# Patient Record
Sex: Female | Born: 1970 | Race: Black or African American | Hispanic: No
Health system: Southern US, Community
[De-identification: ages and names within clinical notes are randomized; demographics above are authoritative.]

## PROBLEM LIST (undated history)

## (undated) DIAGNOSIS — N390 Urinary tract infection, site not specified: Secondary | ICD-10-CM

## (undated) DIAGNOSIS — N760 Acute vaginitis: Secondary | ICD-10-CM

## (undated) DIAGNOSIS — B9689 Other specified bacterial agents as the cause of diseases classified elsewhere: Secondary | ICD-10-CM

## (undated) HISTORY — PX: TUBAL LIGATION: SHX77

## (undated) HISTORY — DX: Urinary tract infection, site not specified: N39.0

## (undated) HISTORY — DX: Acute vaginitis: N76.0

## (undated) HISTORY — DX: Other specified bacterial agents as the cause of diseases classified elsewhere: B96.89

---

## 1997-07-25 ENCOUNTER — Inpatient Hospital Stay (HOSPITAL_COMMUNITY): Admission: AD | Admit: 1997-07-25 | Discharge: 1997-07-25 | Payer: Self-pay | Admitting: *Deleted

## 1997-09-08 ENCOUNTER — Ambulatory Visit (HOSPITAL_COMMUNITY): Admission: RE | Admit: 1997-09-08 | Discharge: 1997-09-08 | Payer: Self-pay | Admitting: Obstetrics & Gynecology

## 1997-10-02 ENCOUNTER — Other Ambulatory Visit: Admission: RE | Admit: 1997-10-02 | Discharge: 1997-10-02 | Payer: Self-pay | Admitting: Obstetrics

## 1997-11-30 ENCOUNTER — Inpatient Hospital Stay (HOSPITAL_COMMUNITY): Admission: AD | Admit: 1997-11-30 | Discharge: 1997-11-30 | Payer: Self-pay | Admitting: Obstetrics & Gynecology

## 1997-12-04 ENCOUNTER — Inpatient Hospital Stay (HOSPITAL_COMMUNITY): Admission: AD | Admit: 1997-12-04 | Discharge: 1997-12-07 | Payer: Self-pay | Admitting: Obstetrics & Gynecology

## 1997-12-11 ENCOUNTER — Inpatient Hospital Stay (HOSPITAL_COMMUNITY): Admission: AD | Admit: 1997-12-11 | Discharge: 1997-12-11 | Payer: Self-pay | Admitting: Obstetrics

## 1997-12-19 ENCOUNTER — Inpatient Hospital Stay (HOSPITAL_COMMUNITY): Admission: AD | Admit: 1997-12-19 | Discharge: 1997-12-19 | Payer: Self-pay | Admitting: *Deleted

## 1997-12-29 ENCOUNTER — Inpatient Hospital Stay (HOSPITAL_COMMUNITY): Admission: AD | Admit: 1997-12-29 | Discharge: 1997-12-29 | Payer: Self-pay | Admitting: *Deleted

## 1998-01-04 ENCOUNTER — Inpatient Hospital Stay (HOSPITAL_COMMUNITY): Admission: AD | Admit: 1998-01-04 | Discharge: 1998-01-04 | Payer: Self-pay | Admitting: Obstetrics

## 1998-01-08 ENCOUNTER — Inpatient Hospital Stay (HOSPITAL_COMMUNITY): Admission: AD | Admit: 1998-01-08 | Discharge: 1998-01-08 | Payer: Self-pay | Admitting: Obstetrics

## 1998-01-12 ENCOUNTER — Inpatient Hospital Stay (HOSPITAL_COMMUNITY): Admission: AD | Admit: 1998-01-12 | Discharge: 1998-01-12 | Payer: Self-pay | Admitting: *Deleted

## 1998-01-16 ENCOUNTER — Inpatient Hospital Stay (HOSPITAL_COMMUNITY): Admission: AD | Admit: 1998-01-16 | Discharge: 1998-01-16 | Payer: Self-pay | Admitting: *Deleted

## 1998-01-24 ENCOUNTER — Inpatient Hospital Stay (HOSPITAL_COMMUNITY): Admission: AD | Admit: 1998-01-24 | Discharge: 1998-01-25 | Payer: Self-pay | Admitting: Obstetrics

## 1998-12-09 ENCOUNTER — Emergency Department (HOSPITAL_COMMUNITY): Admission: EM | Admit: 1998-12-09 | Discharge: 1998-12-09 | Payer: Self-pay | Admitting: Emergency Medicine

## 1999-02-02 ENCOUNTER — Emergency Department (HOSPITAL_COMMUNITY): Admission: EM | Admit: 1999-02-02 | Discharge: 1999-02-02 | Payer: Self-pay | Admitting: Emergency Medicine

## 2000-01-19 ENCOUNTER — Emergency Department (HOSPITAL_COMMUNITY): Admission: EM | Admit: 2000-01-19 | Discharge: 2000-01-19 | Payer: Self-pay | Admitting: Emergency Medicine

## 2001-01-20 ENCOUNTER — Emergency Department (HOSPITAL_COMMUNITY): Admission: EM | Admit: 2001-01-20 | Discharge: 2001-01-21 | Payer: Self-pay | Admitting: Emergency Medicine

## 2001-01-21 ENCOUNTER — Emergency Department (HOSPITAL_COMMUNITY): Admission: EM | Admit: 2001-01-21 | Discharge: 2001-01-22 | Payer: Self-pay | Admitting: Emergency Medicine

## 2002-05-27 ENCOUNTER — Encounter: Admission: RE | Admit: 2002-05-27 | Discharge: 2002-05-27 | Payer: Self-pay | Admitting: Family Medicine

## 2002-12-04 ENCOUNTER — Encounter: Admission: RE | Admit: 2002-12-04 | Discharge: 2002-12-04 | Payer: Self-pay | Admitting: Family Medicine

## 2003-06-16 ENCOUNTER — Encounter: Admission: RE | Admit: 2003-06-16 | Discharge: 2003-06-16 | Payer: Self-pay | Admitting: Family Medicine

## 2003-07-27 ENCOUNTER — Encounter: Admission: RE | Admit: 2003-07-27 | Discharge: 2003-07-27 | Payer: Self-pay | Admitting: Family Medicine

## 2003-07-28 ENCOUNTER — Encounter: Admission: RE | Admit: 2003-07-28 | Discharge: 2003-07-28 | Payer: Self-pay | Admitting: Sports Medicine

## 2003-07-29 ENCOUNTER — Encounter: Admission: RE | Admit: 2003-07-29 | Discharge: 2003-07-29 | Payer: Self-pay | Admitting: Sports Medicine

## 2003-08-31 ENCOUNTER — Encounter: Admission: RE | Admit: 2003-08-31 | Discharge: 2003-08-31 | Payer: Self-pay | Admitting: Family Medicine

## 2004-06-17 ENCOUNTER — Ambulatory Visit: Payer: Self-pay | Admitting: Family Medicine

## 2005-01-24 ENCOUNTER — Ambulatory Visit: Payer: Self-pay | Admitting: Sports Medicine

## 2005-01-27 ENCOUNTER — Ambulatory Visit: Payer: Self-pay | Admitting: Family Medicine

## 2005-03-01 ENCOUNTER — Ambulatory Visit: Payer: Self-pay | Admitting: Sports Medicine

## 2005-03-08 ENCOUNTER — Ambulatory Visit: Payer: Self-pay | Admitting: Family Medicine

## 2005-07-30 ENCOUNTER — Encounter (INDEPENDENT_AMBULATORY_CARE_PROVIDER_SITE_OTHER): Payer: Self-pay | Admitting: *Deleted

## 2005-08-09 ENCOUNTER — Encounter (INDEPENDENT_AMBULATORY_CARE_PROVIDER_SITE_OTHER): Payer: Self-pay | Admitting: *Deleted

## 2005-08-09 ENCOUNTER — Other Ambulatory Visit: Admission: RE | Admit: 2005-08-09 | Discharge: 2005-08-09 | Payer: Self-pay | Admitting: Family Medicine

## 2005-08-09 ENCOUNTER — Ambulatory Visit: Payer: Self-pay | Admitting: Family Medicine

## 2006-06-28 DIAGNOSIS — N83209 Unspecified ovarian cyst, unspecified side: Secondary | ICD-10-CM

## 2006-06-28 DIAGNOSIS — D509 Iron deficiency anemia, unspecified: Secondary | ICD-10-CM | POA: Insufficient documentation

## 2006-06-29 ENCOUNTER — Encounter (INDEPENDENT_AMBULATORY_CARE_PROVIDER_SITE_OTHER): Payer: Self-pay | Admitting: *Deleted

## 2007-01-15 ENCOUNTER — Emergency Department (HOSPITAL_COMMUNITY): Admission: EM | Admit: 2007-01-15 | Discharge: 2007-01-15 | Payer: Self-pay | Admitting: Emergency Medicine

## 2007-01-31 ENCOUNTER — Encounter (INDEPENDENT_AMBULATORY_CARE_PROVIDER_SITE_OTHER): Payer: Self-pay | Admitting: Family Medicine

## 2007-01-31 ENCOUNTER — Other Ambulatory Visit: Admission: RE | Admit: 2007-01-31 | Discharge: 2007-01-31 | Payer: Self-pay | Admitting: Family Medicine

## 2007-01-31 ENCOUNTER — Ambulatory Visit: Payer: Self-pay | Admitting: Family Medicine

## 2007-01-31 DIAGNOSIS — R300 Dysuria: Secondary | ICD-10-CM | POA: Insufficient documentation

## 2007-01-31 DIAGNOSIS — B9689 Other specified bacterial agents as the cause of diseases classified elsewhere: Secondary | ICD-10-CM | POA: Insufficient documentation

## 2007-01-31 DIAGNOSIS — N760 Acute vaginitis: Secondary | ICD-10-CM

## 2007-01-31 LAB — CONVERTED CEMR LAB
Bilirubin Urine: NEGATIVE
Chlamydia, DNA Probe: NEGATIVE
GC Probe Amp, Genital: NEGATIVE
Nitrite: NEGATIVE
Protein, U semiquant: NEGATIVE
Urobilinogen, UA: 0.2

## 2007-02-04 ENCOUNTER — Encounter (INDEPENDENT_AMBULATORY_CARE_PROVIDER_SITE_OTHER): Payer: Self-pay | Admitting: Family Medicine

## 2007-06-24 ENCOUNTER — Encounter (INDEPENDENT_AMBULATORY_CARE_PROVIDER_SITE_OTHER): Payer: Self-pay | Admitting: Family Medicine

## 2007-06-24 ENCOUNTER — Ambulatory Visit: Payer: Self-pay | Admitting: Sports Medicine

## 2007-06-24 DIAGNOSIS — N920 Excessive and frequent menstruation with regular cycle: Secondary | ICD-10-CM

## 2007-06-24 DIAGNOSIS — R079 Chest pain, unspecified: Secondary | ICD-10-CM

## 2007-06-24 DIAGNOSIS — R011 Cardiac murmur, unspecified: Secondary | ICD-10-CM

## 2007-06-24 LAB — CONVERTED CEMR LAB
Bilirubin Urine: NEGATIVE
Glucose, Urine, Semiquant: NEGATIVE
Ketones, urine, test strip: NEGATIVE
RBC / HPF: 0
Urobilinogen, UA: 0.2
pH: 7

## 2007-06-25 LAB — CONVERTED CEMR LAB
Hemoglobin: 7.3 g/dL — CL (ref 12.0–15.0)
MCHC: 26.1 g/dL — ABNORMAL LOW (ref 30.0–36.0)
MCV: 63.9 fL — ABNORMAL LOW (ref 78.0–100.0)
RBC: 4.38 M/uL (ref 3.87–5.11)
TSH: 0.878 microintl units/mL (ref 0.350–5.50)
WBC: 4.7 10*3/uL (ref 4.0–10.5)

## 2007-06-27 ENCOUNTER — Encounter (INDEPENDENT_AMBULATORY_CARE_PROVIDER_SITE_OTHER): Payer: Self-pay | Admitting: *Deleted

## 2007-06-28 ENCOUNTER — Telehealth (INDEPENDENT_AMBULATORY_CARE_PROVIDER_SITE_OTHER): Payer: Self-pay | Admitting: Family Medicine

## 2007-06-28 ENCOUNTER — Encounter: Admission: RE | Admit: 2007-06-28 | Discharge: 2007-06-28 | Payer: Self-pay | Admitting: Family Medicine

## 2007-08-15 ENCOUNTER — Other Ambulatory Visit: Admission: RE | Admit: 2007-08-15 | Discharge: 2007-08-15 | Payer: Self-pay | Admitting: Family Medicine

## 2007-08-15 ENCOUNTER — Ambulatory Visit: Payer: Self-pay | Admitting: Obstetrics & Gynecology

## 2007-08-29 ENCOUNTER — Ambulatory Visit: Payer: Self-pay | Admitting: Obstetrics and Gynecology

## 2008-08-05 ENCOUNTER — Ambulatory Visit: Payer: Self-pay | Admitting: Family Medicine

## 2008-08-05 ENCOUNTER — Encounter (INDEPENDENT_AMBULATORY_CARE_PROVIDER_SITE_OTHER): Payer: Self-pay | Admitting: Family Medicine

## 2008-08-05 ENCOUNTER — Other Ambulatory Visit: Admission: RE | Admit: 2008-08-05 | Discharge: 2008-08-05 | Payer: Self-pay | Admitting: Family Medicine

## 2008-08-05 ENCOUNTER — Encounter: Payer: Self-pay | Admitting: *Deleted

## 2008-08-05 DIAGNOSIS — N63 Unspecified lump in unspecified breast: Secondary | ICD-10-CM | POA: Insufficient documentation

## 2008-08-05 LAB — CONVERTED CEMR LAB
Glucose, Urine, Semiquant: NEGATIVE
Ketones, urine, test strip: NEGATIVE
Protein, U semiquant: 30
Specific Gravity, Urine: 1.02
pH: 7

## 2008-08-06 LAB — CONVERTED CEMR LAB
Cholesterol: 106 mg/dL (ref 0–200)
HDL: 60 mg/dL (ref >39)
MCHC: 26.1 g/dL — ABNORMAL LOW (ref 30.0–36.0)
Platelets: 223 10*3/uL (ref 150–400)
RDW: 21.8 % — ABNORMAL HIGH (ref 11.5–15.5)
Triglycerides: 56 mg/dL (ref <150)

## 2008-08-11 ENCOUNTER — Encounter (INDEPENDENT_AMBULATORY_CARE_PROVIDER_SITE_OTHER): Payer: Self-pay | Admitting: Family Medicine

## 2009-05-14 ENCOUNTER — Ambulatory Visit: Payer: Self-pay | Admitting: Family Medicine

## 2009-10-09 ENCOUNTER — Emergency Department (HOSPITAL_COMMUNITY): Admission: EM | Admit: 2009-10-09 | Discharge: 2009-10-09 | Payer: Self-pay | Admitting: Emergency Medicine

## 2009-12-20 ENCOUNTER — Emergency Department (HOSPITAL_COMMUNITY): Admission: EM | Admit: 2009-12-20 | Discharge: 2009-12-20 | Payer: Self-pay | Admitting: Emergency Medicine

## 2010-05-22 ENCOUNTER — Encounter: Payer: Self-pay | Admitting: Family Medicine

## 2010-05-31 NOTE — Assessment & Plan Note (Signed)
Summary: tb test,tcb  Nurse Visit   Allergies: 1)  Erythromycin (Erythromycin)  Immunizations Administered:  PPD Skin Test:    Vaccine Type: PPD    Site: right forearm    Mfr: Sanofi Pasteur    Dose: 0.1 ml    Route: ID    Given by: Theresia Lo RN    Exp. Date: 08/25/2011    Lot #: [id number]  Orders Added: 1)  TB Skin Test [45409] 2)  Admin 1st Vaccine [90471]   patient presents a form for her work that MD will need to fill out. last CPE was April of 2010. will place form in MD box..patient will return on 05/17/2009 to have PPD read. she understands that form may not be filled out by that time and she knows she may have to come back later to pick up form. Theresia Lo RN  May 14, 2009 1:49 PM

## 2010-06-01 ENCOUNTER — Encounter: Payer: Self-pay | Admitting: *Deleted

## 2010-07-15 LAB — URINALYSIS, ROUTINE W REFLEX MICROSCOPIC
Glucose, UA: NEGATIVE mg/dL
Protein, ur: NEGATIVE mg/dL
Specific Gravity, Urine: 1.012 (ref 1.005–1.030)
pH: 8.5 — ABNORMAL HIGH (ref 5.0–8.0)

## 2010-07-15 LAB — CBC
HCT: 25.9 % — ABNORMAL LOW (ref 36.0–46.0)
MCHC: 29.7 g/dL — ABNORMAL LOW (ref 30.0–36.0)
RDW: 20.2 % — ABNORMAL HIGH (ref 11.5–15.5)

## 2010-07-15 LAB — BASIC METABOLIC PANEL
BUN: 6 mg/dL (ref 6–23)
Creatinine, Ser: 0.82 mg/dL (ref 0.4–1.2)
GFR calc non Af Amer: >60 mL/min (ref >60)
Glucose, Bld: 91 mg/dL (ref 70–99)
Potassium: 4.3 mEq/L (ref 3.5–5.1)

## 2010-07-15 LAB — DIFFERENTIAL
Basophils Relative: 1 % (ref 0–1)
Eosinophils Absolute: 0.3 10*3/uL (ref 0.0–0.7)
Monocytes Absolute: 0.4 10*3/uL (ref 0.1–1.0)
Neutro Abs: 4.1 10*3/uL (ref 1.7–7.7)
Neutrophils Relative %: 57 % (ref 43–77)

## 2010-07-15 LAB — URINE MICROSCOPIC-ADD ON

## 2010-07-18 LAB — URINALYSIS, ROUTINE W REFLEX MICROSCOPIC
Bilirubin Urine: NEGATIVE
Glucose, UA: NEGATIVE mg/dL
Hgb urine dipstick: NEGATIVE
Ketones, ur: 40 mg/dL — AB
Nitrite: NEGATIVE
Protein, ur: NEGATIVE mg/dL
Specific Gravity, Urine: 1.025 (ref 1.005–1.030)
Urobilinogen, UA: 0.2 mg/dL (ref 0.0–1.0)
pH: 7 (ref 5.0–8.0)

## 2010-07-18 LAB — D-DIMER, QUANTITATIVE: D-Dimer, Quant: 0.34 ug/mL-FEU (ref 0.00–0.48)

## 2010-07-18 LAB — POCT I-STAT, CHEM 8
BUN: 3 mg/dL — ABNORMAL LOW (ref 6–23)
Calcium, Ion: 1.1 mmol/L — ABNORMAL LOW (ref 1.12–1.32)
Chloride: 103 mEq/L (ref 96–112)
Creatinine, Ser: 0.7 mg/dL (ref 0.4–1.2)
Glucose, Bld: 120 mg/dL — ABNORMAL HIGH (ref 70–99)
HCT: 34 % — ABNORMAL LOW (ref 36.0–46.0)
Hemoglobin: 11.6 g/dL — ABNORMAL LOW (ref 12.0–15.0)
Potassium: 4.1 mEq/L (ref 3.5–5.1)
Sodium: 135 meq/L (ref 135–145)
TCO2: 23 mmol/L (ref 0–100)

## 2010-07-18 LAB — POCT PREGNANCY, URINE: Preg Test, Ur: NEGATIVE

## 2010-09-13 NOTE — Group Therapy Note (Signed)
Laura Mckenzie, SAMMARCO NO.:  [id number]   MEDICAL RECORD NO.:  [id number]          PATIENT TYPE:  WOC   LOCATION:  [facility]                   FACILITY:  [facility]   PHYSICIAN:  Johnella Moloney, MD        DATE OF BIRTH:  [birth year]   DATE OF SERVICE:                                  CLINIC NOTE   CHIEF COMPLAINT:  Menorrhagia.   HISTORY OF PRESENT ILLNESS:  The patient is a 40 year old G48, P-0-0-4  who was referred from the [facility] for evaluation of her  menorrhagia.  The patient reports menarche at age 49.  She also reports  having regular menstrual cycles and periods that last 3-5 days with very  heavy flow and severe pain.  The patient reports that her periods  consist of having very heavy bleeding using multiple tampons and pads,  and ruining clothes.  The patient was found to be anemic with a  hemoglobin of 6.8 in spring of 2007.  She has been treated with OCPs  which the patient does not like and they have made her sick.  She has  also had a hemoglobin electrophoresis in the past and that was normal.  As part of her workup, she had workup for iron-deficiency anemia and  found to be iron deficient with a ferritin of 3. The patient was  subsequently treated with p.o. iron.  Since then, she was lost to  followup.  When she was last seen in February, she did have an  ultrasound which was remarkable for uterus measuring 9.8 cm x 5.3 cm x  5.8.  Her endometrial lining was noticeably thickened measuring 21 mm  and normal adnexa.  The patient was then referred to the GYN clinic for  an endometrial biopsy and further discussion about management of her  menorrhagia.   PAST MEDICAL HISTORY:  1. Kidney problems.  2. Heart murmur.  3. High blood pressure.   PAST SURGICAL HISTORY:  None.   PAST OB/GYN HISTORY:  Menstrual history as above.  The patient has had a  tubal ligation.  She has had 3 vaginal deliveries and 1 cesarean  section.  Her last Pap smear  was in October 2008 which was normal.  She  denies any history of abnormal Pap smears   MEDICATIONS:  Midol as needed.   ALLERGIES:  ERYTHROMYCIN.  The patient is not allergic to latex.   SOCIAL HISTORY:  The patient lives with her 4 children and 2  grandchildren.  She currently is employed.  She does not smoke.  She  drinks 2 drinks per week.  She denies any illicit drugs current or past  abuse.   REVIEW OF SYSTEMS:  The patient endorses some fatigue, frequent  headaches, nausea, vomiting, hot flashes and night sweats.  Of note, the  patient did have a TSH that was checked in family practice that was  0.878.  Her last CBC June 23, 2088 was remarkable for hemoglobin of  7.3 and a hematocrit of 28.0.   PHYSICAL EXAMINATION:  VITAL SIGNS:  Temperature 97.6, pulse 80, blood  pressure  107/79, respirations 16, weight 131.8 pounds, height 5 feet 4  inches.  GENERAL:  No apparent distress.  ABDOMEN:  Soft, nontender, nondistended.  Well-healed Pfannenstiel  incision.  PELVIC:  Normal external female genitalia.  Pink well rugated vagina.  No lesions.  Normal cervical contour, multiparous.  No uterine or  adnexal tenderness.   PROCEDURE:  Endometrial biopsy:  The patient was counseled regarding the  need for endometrial biopsy.  The risks of the biopsy were explained to  the patient and informed consent was obtained.  Her urine pregnancy was  negative.  The patient's cervix was swabbed with Betadine and anterior  lip of the cervix was grasped with a tenaculum.  The sterile __________  Pipelle was introduced into the uterine cavity and moderate amount of  tissue was obtained.  Of note, the uterus was found to a length of 7 cm.  Two passes were made and a moderate amount of tissue was obtained and  sent to pathology.  The patient was given the discharge instruction  sheet for endometrial biopsy with any signs or symptoms to watch out for  and also for her to schedule an appointment in  2 weeks for discussion of  results.   ASSESSMENT/PLAN:  The patient is a 40 year old G4, P4 who was referred  for menorrhagia.  The patient is status post an endometrial biopsy at  this point.  We will follow up the results.  Discussed options of  managing menorrhagia with the patient including hormonal methods,  including oral contraceptive pills, Depo-Provera, Mirena IUD, surgical  actions involved in a D&C, endometrial ablation or a hysterectomy.  The  patient was given written information about all these modalities to  review at home.  She will come back in 2 weeks with a decision of what  she wants to do and also to follow up her endometrial biopsy results.  In the meantime, the patient was given a prescription of ibuprofen 800  mg p.o. t.i.d. p.r.n. pain to help with her cramping after the procedure  and bleeding precautions were reviewed.           ______________________________  Johnella Moloney, MD     UD/MEDQ  D:  08/15/2007  T:  08/15/2007  Job:  [id number]

## 2010-09-23 ENCOUNTER — Other Ambulatory Visit: Payer: Self-pay | Admitting: Family Medicine

## 2010-09-23 DIAGNOSIS — Z1231 Encounter for screening mammogram for malignant neoplasm of breast: Secondary | ICD-10-CM

## 2010-10-06 ENCOUNTER — Ambulatory Visit
Admission: RE | Admit: 2010-10-06 | Discharge: 2010-10-06 | Disposition: A | Discharge status: Home / Self Care | Payer: Medicaid Other | Source: Ambulatory Visit | Attending: Family Medicine | Admitting: Family Medicine

## 2010-10-06 DIAGNOSIS — Z1231 Encounter for screening mammogram for malignant neoplasm of breast: Secondary | ICD-10-CM

## 2010-10-11 ENCOUNTER — Other Ambulatory Visit: Payer: Self-pay | Admitting: Family Medicine

## 2010-10-11 DIAGNOSIS — R928 Other abnormal and inconclusive findings on diagnostic imaging of breast: Secondary | ICD-10-CM

## 2010-10-20 ENCOUNTER — Encounter: Payer: Self-pay | Admitting: Family Medicine

## 2010-11-17 ENCOUNTER — Other Ambulatory Visit (HOSPITAL_COMMUNITY)
Admission: RE | Admit: 2010-11-17 | Discharge: 2010-11-17 | Disposition: A | Discharge status: Home / Self Care | Payer: Medicaid Other | Source: Ambulatory Visit | Attending: Family Medicine | Admitting: Family Medicine

## 2010-11-17 ENCOUNTER — Ambulatory Visit (INDEPENDENT_AMBULATORY_CARE_PROVIDER_SITE_OTHER): Payer: Medicaid Other | Admitting: Family Medicine

## 2010-11-17 DIAGNOSIS — N898 Other specified noninflammatory disorders of vagina: Secondary | ICD-10-CM

## 2010-11-17 DIAGNOSIS — F39 Unspecified mood [affective] disorder: Secondary | ICD-10-CM

## 2010-11-17 DIAGNOSIS — Z01419 Encounter for gynecological examination (general) (routine) without abnormal findings: Secondary | ICD-10-CM | POA: Insufficient documentation

## 2010-11-17 DIAGNOSIS — R300 Dysuria: Secondary | ICD-10-CM

## 2010-11-17 DIAGNOSIS — Z124 Encounter for screening for malignant neoplasm of cervix: Secondary | ICD-10-CM

## 2010-11-17 DIAGNOSIS — R030 Elevated blood-pressure reading, without diagnosis of hypertension: Secondary | ICD-10-CM

## 2010-11-17 DIAGNOSIS — F1020 Alcohol dependence, uncomplicated: Secondary | ICD-10-CM

## 2010-11-17 DIAGNOSIS — N760 Acute vaginitis: Secondary | ICD-10-CM

## 2010-11-17 LAB — POCT URINALYSIS DIPSTICK
Bilirubin, UA: NEGATIVE
Glucose, UA: NEGATIVE
Ketones, UA: NEGATIVE
Nitrite, UA: NEGATIVE

## 2010-11-17 LAB — POCT UA - MICROSCOPIC ONLY

## 2010-11-17 LAB — POCT WET PREP (WET MOUNT): Yeast Wet Prep HPF POC: NEGATIVE

## 2010-11-17 NOTE — Patient Instructions (Signed)
Thank you for coming in today. 1) I am worried about Bipolar disorder. Please make an appointment with the [facility] [phone] about both your alcohol use and your depression.  2) Let me know if you feel like hurting yourself or others.  3)  Come back in 1-2 weeks to follow up your blood pressure and your mood and alcohol use.  4) Try to continue to not drink alcohol.  5) I will call you with your test results and treatment plan for your urine and discharge.

## 2010-11-18 ENCOUNTER — Encounter: Payer: Self-pay | Admitting: Family Medicine

## 2010-11-18 DIAGNOSIS — R030 Elevated blood-pressure reading, without diagnosis of hypertension: Secondary | ICD-10-CM | POA: Insufficient documentation

## 2010-11-18 DIAGNOSIS — F39 Unspecified mood [affective] disorder: Secondary | ICD-10-CM | POA: Insufficient documentation

## 2010-11-18 DIAGNOSIS — IMO0001 Reserved for inherently not codable concepts without codable children: Secondary | ICD-10-CM | POA: Insufficient documentation

## 2010-11-18 DIAGNOSIS — F1020 Alcohol dependence, uncomplicated: Secondary | ICD-10-CM | POA: Insufficient documentation

## 2010-11-18 LAB — GC/CHLAMYDIA PROBE AMP, GENITAL
Chlamydia, DNA Probe: NEGATIVE
GC Probe Amp, Genital: NEGATIVE

## 2010-11-18 MED ORDER — METRONIDAZOLE 500 MG PO TABS: 500.0000 mg | ORAL_TABLET | Freq: Two times a day (BID) | ORAL | Status: AC

## 2010-11-18 NOTE — Assessment & Plan Note (Signed)
Ms. Laura Mckenzie blood pressure is elevated today. However she is not symptomatic and we will defer this issue until the next visit at that point we'll recheck blood pressure and make a diagnosis of hypertension still elevated. If elevated will likely use hydrochlorothiazide.

## 2010-11-18 NOTE — Assessment & Plan Note (Signed)
Ms Darcus Austin certainly has a mood disorder.  However this not sure if it's depression or bipolar disorder or some other unknown mood disorder.  Her alcohol use is complicating the picture.  However I suspect bipolar disorder as the primary cause.  Her mood disorder questionnaire was certainly positive, and she is having trouble with legal issues and substance abuse issues.  My plan is to refer her to the [facility] for further management of her alcohol abuse and evaluation and management of her mood disorder.  We discussed safety and the importance of calling myself or going to the emergency room if she feels like hurting herself or others.  The patient expresses understanding.  We will followup in one to 2 weeks.

## 2010-11-18 NOTE — Assessment & Plan Note (Signed)
Found to be BV on wet prep. No alcohol in a few days so will use flagyl x 1 week.  Will f/u as needed. Also will check for GC/CHL

## 2010-11-18 NOTE — Assessment & Plan Note (Signed)
Laura Mckenzie is using alcohol to excess. She is drinking approximately 12 units of alcohol daily.  This is certainly a worrisome amount. Additionally this is complicated by her mood disorder.  Fortunately she has never had withdrawal symptoms and has not had a drink in a few days. Plan as above with mood disorder we will refer to the [facility] for further management of her substance abuse disorder.  Will followup in one to 2 weeks.

## 2010-11-18 NOTE — Assessment & Plan Note (Signed)
Not sure if true urinary tract infection history urinalysis contaminated by many epithelial cells that are also clue cells.  My plan is to treat bacterial vaginosis with metronidazole as above and to followup in one to 2 weeks. If still symptomatic will recollect  a sample using a  true clean catch and consider culture.

## 2010-11-18 NOTE — Progress Notes (Signed)
Ms Laura Mckenzie presents to clinic today for a CPE however she is having multiple medical issues today and wishes to focus on her acute needs.   1) Vaginal Discharged. White with some odor noted for a few weeks. She denies any abdominal pain. She does note some dysuria.   2) Dysuria: Noted also for a few weeks. Associated with urinary urgency and frequency. No back or abdominal pain.  No fevers or chills.  3) Depression: Notes feeling down and depressed. Notes anhedonia, poor appetite. No active SI/HI. Feels other people would be happier if she were not around. She denies any suicidal plan. She has felt this way for around one year. She thinks that she may be feeling bad because she is due in court for her 3rd charge of credit card fraud. When asked about other mood disorders she does note a + FH for bipolar. Mood disorder questionnaire is positive with 15 items.  4) Alcohol: When asking about bipolar disorder Ms Laura Mckenzie notes that she drinks 3-4 16oz beers almost every day. Her CAGE is 3/4. She has never had alcohol withdrawal. Her last drink was a few days ago.   PMH reviewed.  ROS as above otherwise neg  Exam:  Vs noted. BP 149/85  Pulse 80  Temp(Src) 98.1 F (36.7 C) (Oral)  Ht 5\' 4"  (1.626 m)  Wt 127 lb 12.8 oz (57.97 kg)  BMI 21.94 kg/m2  LMP 11/01/2010 Gen: Well NAD HEENT: EOMI, PERRL, MMM Lungs: CTABL Nl WOB Heart: RRR 2/6 SEM non radiating.  Abd: NABS, NT, ND Exts: Non edematous BL  LE Pelvis: Normal external genitalia. Moderate white discharge. No cervical motion tenderness.

## 2010-11-21 ENCOUNTER — Ambulatory Visit: Payer: Medicaid Other | Admitting: *Deleted

## 2010-11-21 DIAGNOSIS — Z111 Encounter for screening for respiratory tuberculosis: Secondary | ICD-10-CM

## 2010-11-23 ENCOUNTER — Ambulatory Visit
Admission: RE | Admit: 2010-11-23 | Discharge: 2010-11-23 | Disposition: A | Discharge status: Home / Self Care | Payer: No Typology Code available for payment source | Source: Ambulatory Visit | Attending: Infectious Diseases | Admitting: Infectious Diseases

## 2010-11-23 ENCOUNTER — Other Ambulatory Visit: Payer: Self-pay | Admitting: Infectious Diseases

## 2010-11-23 ENCOUNTER — Ambulatory Visit (INDEPENDENT_AMBULATORY_CARE_PROVIDER_SITE_OTHER): Payer: Medicaid Other | Admitting: *Deleted

## 2010-11-23 DIAGNOSIS — R7611 Nonspecific reaction to tuberculin skin test without active tuberculosis: Secondary | ICD-10-CM

## 2010-11-23 LAB — TB SKIN TEST: TB Skin Test: POSITIVE mm

## 2010-11-23 NOTE — Progress Notes (Signed)
PPD positive. 15 X 11 mm induration.  Dr. [place] verified.  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  appointment scheduled at [facility] Dept today at 3:00 PM

## 2010-11-24 ENCOUNTER — Other Ambulatory Visit: Payer: Self-pay | Admitting: Family Medicine

## 2010-11-28 ENCOUNTER — Ambulatory Visit: Payer: Medicaid Other | Admitting: Family Medicine

## 2010-12-29 ENCOUNTER — Telehealth: Payer: Self-pay | Admitting: Family Medicine

## 2010-12-29 NOTE — Telephone Encounter (Signed)
Called and left a message regarding mammogram report from June.  Asked her to call back and discuss report.  She will likely need an ultrasound of her breast.  Will follow

## 2011-04-03 ENCOUNTER — Ambulatory Visit: Payer: No Typology Code available for payment source | Admitting: Family Medicine

## 2012-01-13 ENCOUNTER — Emergency Department (HOSPITAL_COMMUNITY)
Admission: EM | Admit: 2012-01-13 | Discharge: 2012-01-13 | Disposition: A | Discharge status: Home / Self Care | Payer: Medicaid Other | Attending: Emergency Medicine | Admitting: Emergency Medicine

## 2012-01-13 ENCOUNTER — Encounter (HOSPITAL_COMMUNITY): Payer: Self-pay | Admitting: Emergency Medicine

## 2012-01-13 DIAGNOSIS — N390 Urinary tract infection, site not specified: Secondary | ICD-10-CM | POA: Insufficient documentation

## 2012-01-13 LAB — BASIC METABOLIC PANEL
BUN: 8 mg/dL (ref 6–23)
CO2: 25 mEq/L (ref 19–32)
Chloride: 101 mEq/L (ref 96–112)
Creatinine, Ser: 0.86 mg/dL (ref 0.50–1.10)
Glucose, Bld: 88 mg/dL (ref 70–99)

## 2012-01-13 LAB — URINALYSIS, ROUTINE W REFLEX MICROSCOPIC
Ketones, ur: >80 mg/dL — AB
Nitrite: POSITIVE — AB
Protein, ur: NEGATIVE mg/dL
pH: 6 (ref 5.0–8.0)

## 2012-01-13 LAB — WET PREP, GENITAL
Clue Cells Wet Prep HPF POC: NONE SEEN
Trich, Wet Prep: NONE SEEN
WBC, Wet Prep HPF POC: NONE SEEN
Yeast Wet Prep HPF POC: NONE SEEN

## 2012-01-13 LAB — CBC
HCT: 27.8 % — ABNORMAL LOW (ref 36.0–46.0)
MCH: 18.4 pg — ABNORMAL LOW (ref 26.0–34.0)
MCV: 63.2 fL — ABNORMAL LOW (ref 78.0–100.0)
RBC: 4.4 MIL/uL (ref 3.87–5.11)
WBC: 3.9 10*3/uL — ABNORMAL LOW (ref 4.0–10.5)

## 2012-01-13 LAB — POCT PREGNANCY, URINE: Preg Test, Ur: NEGATIVE

## 2012-01-13 MED ORDER — IBUPROFEN 800 MG PO TABS: 800.0000 mg | ORAL_TABLET | Freq: Once | ORAL | Status: DC

## 2012-01-13 MED ORDER — KETOROLAC TROMETHAMINE 30 MG/ML IJ SOLN
30.0000 mg | Freq: Once | INTRAMUSCULAR | Status: AC
  Administered 2012-01-13: 30 mg via INTRAVENOUS
  Filled 2012-01-13: qty 1

## 2012-01-13 MED ORDER — DEXTROSE 5 % IV SOLN
1.0000 g | Freq: Once | INTRAVENOUS | Status: AC
  Administered 2012-01-13: 1 g via INTRAVENOUS
  Filled 2012-01-13: qty 10

## 2012-01-13 MED ORDER — CEPHALEXIN 500 MG PO CAPS: 500.0000 mg | ORAL_CAPSULE | Freq: Two times a day (BID) | ORAL | Status: AC

## 2012-01-13 MED ORDER — CEFTRIAXONE SODIUM 1 G IJ SOLR: 1.0000 g | Freq: Once | INTRAMUSCULAR | Status: DC

## 2012-01-13 MED ORDER — NAPROXEN 500 MG PO TABS: 500.0000 mg | ORAL_TABLET | Freq: Two times a day (BID) | ORAL | Status: DC

## 2012-01-13 MED ORDER — ONDANSETRON HCL 4 MG/2ML IJ SOLN
4.0000 mg | INTRAMUSCULAR | Status: DC | PRN
  Administered 2012-01-13: 4 mg via INTRAVENOUS
  Filled 2012-01-13: qty 2

## 2012-01-13 MED ORDER — SODIUM CHLORIDE 0.9 % IV BOLUS (SEPSIS)
1000.0000 mL | Freq: Once | INTRAVENOUS | Status: AC
  Administered 2012-01-13: 1000 mL via INTRAVENOUS

## 2012-01-13 MED ORDER — MORPHINE SULFATE 4 MG/ML IJ SOLN
4.0000 mg | Freq: Once | INTRAMUSCULAR | Status: AC
  Administered 2012-01-13: 4 mg via INTRAVENOUS
  Filled 2012-01-13: qty 1

## 2012-01-13 NOTE — ED Notes (Signed)
Pt presents w/ for day hx of abdominal and low back pain. Positive for dysuria, frequency, urgency, and foul odor. Endorses yellowish vaginal discharge

## 2012-01-13 NOTE — ED Provider Notes (Signed)
History     CSN: [id number]  Arrival date & time 01/13/12  1517   First MD Initiated Contact with Patient 01/13/12 1621      Chief Complaint  Patient presents with  . Dysuria    (Consider location/radiation/quality/duration/timing/severity/associated sxs/prior treatment) HPI Comments: Patient is a 41 year old female presents emergency department with chief complaint of dysuria, urinary frequency, and abnormal vaginal discharge.  Onset of symptoms began about a week ago and have been gradually worsening.  Patient developed lower abdominal and back pain 3 days ago. Severity is 8/10 & does not radiate. Pain is described as sharp in nature and associated with nausea but patient denies any vomiting, fever, night sweats, chills, diarrhea, constipation, hematuria or melena.  Patient is a 41 y.o. female presenting with dysuria. The history is provided by the patient.  Dysuria  Associated symptoms include nausea. Pertinent negatives include no chills, no vomiting, no hematuria, no urgency and no flank pain.    Past Medical History  Diagnosis Date  . UTI (lower urinary tract infection)   . Bacterial vaginosis     Past Surgical History  Procedure Date  . Tubal ligation     Family History  Problem Relation Age of Onset  . Bipolar disorder Maternal Aunt     History  Substance Use Topics  . Smoking status: Never Smoker   . Smokeless tobacco: Never Used  . Alcohol Use: No    OB History    Grav Para Term Preterm Abortions TAB SAB Ect Mult Living                  Review of Systems  Constitutional: Positive for appetite change. Negative for fever and chills.  HENT: Negative for neck pain, neck stiffness and dental problem.   Eyes: Negative for visual disturbance.  Respiratory: Negative for cough, chest tightness, shortness of breath and wheezing.   Cardiovascular: Negative for chest pain.  Gastrointestinal: Positive for nausea and abdominal pain. Negative for vomiting,  diarrhea, constipation, blood in stool, abdominal distention, anal bleeding and rectal pain.  Genitourinary: Positive for dysuria and vaginal discharge. Negative for urgency, hematuria, flank pain, decreased urine volume, vaginal bleeding, genital sores, vaginal pain and pelvic pain.  Musculoskeletal: Negative for myalgias and arthralgias.  Skin: Negative for rash.  Neurological: Negative for dizziness, syncope, speech difficulty, numbness and headaches.  Hematological: Does not bruise/bleed easily.  All other systems reviewed and are negative.    Allergies  Erythromycin  Home Medications   Current Outpatient Rx  Name Route Sig Dispense Refill  . IBUPROFEN 200 MG PO TABS Oral Take 400 mg by mouth every 6 (six) hours as needed. pain    . QUETIAPINE FUMARATE 200 MG PO TABS Oral Take 200 mg by mouth at bedtime.      BP 122/79  Pulse 58  Temp 98.3 F (36.8 C) (Oral)  Resp 16  Ht 5\' 4"  (1.626 m)  Wt 135 lb (61.236 kg)  BMI 23.17 kg/m2  SpO2 100%  LMP 12/25/2011  Physical Exam  Nursing note and vitals reviewed. Constitutional: She is oriented to person, place, and time. She appears well-developed and well-nourished. No distress.  HENT:  Head: Normocephalic and atraumatic.  Mouth/Throat: Oropharynx is clear and moist. No oropharyngeal exudate.  Eyes: Conjunctivae normal and EOM are normal. Pupils are equal, round, and reactive to light. No scleral icterus.  Neck: Normal range of motion. Neck supple. No tracheal deviation present. No thyromegaly present.  Cardiovascular: Normal rate, regular rhythm, normal heart  sounds and intact distal pulses.   Pulmonary/Chest: Effort normal and breath sounds normal. No stridor. No respiratory distress. She has no wheezes.  Abdominal: Soft. Normal appearance. She exhibits no shifting dullness, no distension, no abdominal bruit, no ascites and no mass. There is tenderness.    Genitourinary:       Exam performed by Jaci Carrel,  exam  chaperoned Date: 01/13/2012 Pelvic exam: normal external genitalia without evidence of trauma. VULVA: normal appearing vulva with no masses, tenderness or lesion. VAGINA: normal appearing vagina with normal color and discharge, no lesions. CERVIX: normal appearing cervix without lesions, cervical motion tenderness absent, cervical os closed with out purulent discharge; vaginal discharge - white and milky, Wet prep and DNA probe for chlamydia and GC obtained.   ADNEXA: normal adnexa in size, nontender and no masses UTERUS: uterus is normal size, shape, consistency and nontender.    Musculoskeletal: Normal range of motion. She exhibits no edema and no tenderness.  Neurological: She is alert and oriented to person, place, and time. Coordination normal.  Skin: Skin is warm and dry. No rash noted. She is not diaphoretic. No erythema. No pallor.  Psychiatric: She has a normal mood and affect. Her behavior is normal.    ED Course  Procedures (including critical care time)  Labs Reviewed  URINALYSIS, ROUTINE W REFLEX MICROSCOPIC - Abnormal; Notable for the following:    APPearance CLOUDY (*)     Ketones, ur >80 (*)     Nitrite POSITIVE (*)     Leukocytes, UA LARGE (*)     All other components within normal limits  URINE MICROSCOPIC-ADD ON - Abnormal; Notable for the following:    Squamous Epithelial / LPF FEW (*)     Bacteria, UA MANY (*)     All other components within normal limits  CBC - Abnormal; Notable for the following:    WBC 3.9 (*)     Hemoglobin 8.1 (*)     HCT 27.8 (*)     MCV 63.2 (*)     MCH 18.4 (*)     MCHC 29.1 (*)     RDW 18.1 (*)     All other components within normal limits  BASIC METABOLIC PANEL - Abnormal; Notable for the following:    GFR calc non Af Amer 83 (*)     All other components within normal limits  POCT PREGNANCY, URINE  WET PREP, GENITAL  GC/CHLAMYDIA PROBE AMP, GENITAL  RPR  HIV ANTIBODY (ROUTINE TESTING)   No results found.   No diagnosis  found.    MDM  UTI with possible early pyelonephritis  Pt has been diagnosed with a UTI. Pt is afebrile, normotensive, and denies any vomiting. She did report nausea and back pain. Treated in ER w IV rocephin and dc home with antibiotics. Strict return precautions discussed.           Jaci Carrel, [place] 01/13/12 1843

## 2012-01-14 LAB — HIV ANTIBODY (ROUTINE TESTING W REFLEX): HIV: NONREACTIVE

## 2012-01-15 NOTE — ED Provider Notes (Signed)
Medical screening examination/treatment/procedure(s) were performed by non-physician practitioner and as supervising physician I was immediately available for consultation/collaboration.  Toy Baker, MD 01/15/12 [phone]

## 2012-11-17 ENCOUNTER — Emergency Department (HOSPITAL_COMMUNITY)
Admission: EM | Admit: 2012-11-17 | Discharge: 2012-11-17 | Disposition: A | Discharge status: Home / Self Care | Payer: Medicaid Other | Attending: Emergency Medicine | Admitting: Emergency Medicine

## 2012-11-17 ENCOUNTER — Encounter (HOSPITAL_COMMUNITY): Payer: Self-pay | Admitting: *Deleted

## 2012-11-17 DIAGNOSIS — IMO0002 Reserved for concepts with insufficient information to code with codable children: Secondary | ICD-10-CM

## 2012-11-17 DIAGNOSIS — L03019 Cellulitis of unspecified finger: Secondary | ICD-10-CM | POA: Insufficient documentation

## 2012-11-17 DIAGNOSIS — Z87440 Personal history of urinary (tract) infections: Secondary | ICD-10-CM | POA: Insufficient documentation

## 2012-11-17 DIAGNOSIS — Z8742 Personal history of other diseases of the female genital tract: Secondary | ICD-10-CM | POA: Insufficient documentation

## 2012-11-17 MED ORDER — BUPIVACAINE HCL (PF) 0.5 % IJ SOLN
INTRAMUSCULAR | Status: AC
  Filled 2012-11-17: qty 30

## 2012-11-17 NOTE — ED Notes (Signed)
Pt states that her rt thumb has been sore x 1 week; pt states that she may have struck it on something approx 1 week ago; pt c/o swelling to thumb x 24hrs; no obvious injury or trauma noted; pt with jagged and artificial nail to rt thumb.

## 2012-11-17 NOTE — ED Provider Notes (Signed)
Medical screening examination/treatment/procedure(s) were performed by non-physician practitioner and as supervising physician I was immediately available for consultation/collaboration.  Olivia Mackie, MD 11/17/12 [phone]

## 2012-11-17 NOTE — ED Provider Notes (Signed)
History    CSN: [id number] Arrival date & time 11/17/12  0344  First MD Initiated Contact with Patient 11/17/12 [phone]     Chief Complaint  Patient presents with  . Hand Pain   (Consider location/radiation/quality/duration/timing/severity/associated sxs/prior Treatment) HPI Comments: Patient has acrylic nails last manicure 1 week ago since that time R thumb has been sore and throbbing   Patient is a 42 y.o. female presenting with hand pain. The history is provided by the patient.  Hand Pain This is a new problem. The current episode started in the past 7 days. The problem occurs constantly. The problem has been gradually worsening. Pertinent negatives include no joint swelling. The symptoms are aggravated by exertion. She has tried nothing for the symptoms. The treatment provided no relief.   Past Medical History  Diagnosis Date  . UTI (lower urinary tract infection)   . Bacterial vaginosis    Past Surgical History  Procedure Laterality Date  . Tubal ligation     Family History  Problem Relation Age of Onset  . Bipolar disorder Maternal Aunt    History  Substance Use Topics  . Smoking status: Never Smoker   . Smokeless tobacco: Never Used  . Alcohol Use: Yes     Comment: occ   OB History   Grav Para Term Preterm Abortions TAB SAB Ect Mult Living                 Review of Systems  Musculoskeletal: Negative for joint swelling.  Skin: Positive for wound.  All other systems reviewed and are negative.    Allergies  Erythromycin  Home Medications   Current Outpatient Rx  Name  Route  Sig  Dispense  Refill  . ibuprofen (ADVIL,MOTRIN) 200 MG tablet   Oral   Take 400 mg by mouth every 6 (six) hours as needed for pain. pain         . QUEtiapine (SEROQUEL XR) 200 MG 24 hr tablet   Oral   Take 200 mg by mouth at bedtime.          BP 172/98  Pulse 70  Temp(Src) 98.6 F (37 C) (Oral)  Resp 18  Ht 5\' 4"  (1.626 m)  Wt 133 lb (60.328 kg)  BMI 22.82 kg/m2   SpO2 100%  LMP 11/03/2012 Physical Exam  Nursing note and vitals reviewed. Constitutional: She is oriented to person, place, and time. She appears well-developed and well-nourished.  HENT:  Head: Normocephalic.  Eyes: Pupils are equal, round, and reactive to light.  Neck: Normal range of motion.  Cardiovascular: Normal rate and regular rhythm.   Pulmonary/Chest: Effort normal.  Musculoskeletal: She exhibits tenderness.       Hands: Lateral nail bed grown down to quick of nail bed, swollen and fluctuant   Neurological: She is alert and oriented to person, place, and time.  Skin: There is erythema.    ED Course  INCISION AND DRAINAGE Date/Time: 11/17/2012 4:45 AM Performed by: Arman Filter Authorized by: Arman Filter Consent: Verbal consent obtained. Consent given by: patient Patient understanding: patient states understanding of the procedure being performed Patient identity confirmed: verbally with patient Type: abscess Body area: upper extremity Location details: right thumb Anesthesia: digital block Local anesthetic: bupivacaine 0.5% without epinephrine Anesthetic total: 2 ml Patient sedated: no Scalpel size: 11 Needle gauge: 22 Complexity: simple Drainage: purulent Drainage amount: moderate Wound treatment: wound left open   (including critical care time) Labs Reviewed - No data to display  No results found. 1. Infection of nail     MDM  Artificial nail removed and abscess drained   Arman Filter, NP 11/17/12 [phone]

## 2013-07-17 ENCOUNTER — Encounter (HOSPITAL_COMMUNITY): Payer: Self-pay | Admitting: Emergency Medicine

## 2013-07-17 ENCOUNTER — Emergency Department (HOSPITAL_COMMUNITY): Payer: Medicaid Other

## 2013-07-17 ENCOUNTER — Emergency Department (HOSPITAL_COMMUNITY)
Admission: EM | Admit: 2013-07-17 | Discharge: 2013-07-17 | Disposition: A | Discharge status: Home / Self Care | Payer: Medicaid Other | Attending: Emergency Medicine | Admitting: Emergency Medicine

## 2013-07-17 DIAGNOSIS — Z8742 Personal history of other diseases of the female genital tract: Secondary | ICD-10-CM | POA: Insufficient documentation

## 2013-07-17 DIAGNOSIS — Y92838 Other recreation area as the place of occurrence of the external cause: Secondary | ICD-10-CM

## 2013-07-17 DIAGNOSIS — Z87440 Personal history of urinary (tract) infections: Secondary | ICD-10-CM | POA: Insufficient documentation

## 2013-07-17 DIAGNOSIS — M25561 Pain in right knee: Secondary | ICD-10-CM

## 2013-07-17 DIAGNOSIS — S99929A Unspecified injury of unspecified foot, initial encounter: Principal | ICD-10-CM

## 2013-07-17 DIAGNOSIS — Y9239 Other specified sports and athletic area as the place of occurrence of the external cause: Secondary | ICD-10-CM | POA: Insufficient documentation

## 2013-07-17 DIAGNOSIS — X500XXA Overexertion from strenuous movement or load, initial encounter: Secondary | ICD-10-CM | POA: Insufficient documentation

## 2013-07-17 DIAGNOSIS — Z8619 Personal history of other infectious and parasitic diseases: Secondary | ICD-10-CM | POA: Insufficient documentation

## 2013-07-17 DIAGNOSIS — Y9372 Activity, wrestling: Secondary | ICD-10-CM | POA: Insufficient documentation

## 2013-07-17 DIAGNOSIS — S99919A Unspecified injury of unspecified ankle, initial encounter: Principal | ICD-10-CM

## 2013-07-17 DIAGNOSIS — S8990XA Unspecified injury of unspecified lower leg, initial encounter: Secondary | ICD-10-CM | POA: Insufficient documentation

## 2013-07-17 MED ORDER — IBUPROFEN 800 MG PO TABS: 800.0000 mg | ORAL_TABLET | Freq: Three times a day (TID) | ORAL | Status: AC

## 2013-07-17 NOTE — ED Provider Notes (Signed)
CSN: [id number]     Arrival date & time 07/17/13  1053 History  This chart was scribed for non-physician practitioner, Sharilyn Sites, PA-C working with Juliet Rude. Rubin Payor, MD by Greggory Stallion, ED scribe. This patient was seen in room TR05C/TR05C and the patient's care was started at 11:23 AM.   Chief Complaint  Patient presents with  . Knee Pain   The history is provided by the patient. No language interpreter was used.   HPI Comments: Laura Mckenzie is a 43 y.o. female who presents to the Emergency Department complaining of right knee injury that occurred 2 weeks ago. Pt was wrestling and twisted her knee. She has sudden onset right knee pain with associated swelling. Bending or straightening her knee and ambulation worsen the pain. Denies prior injury to her knee.  Pt has been ambulatory, but states she occasionally limps.  Denies numbness or paresthesias.  No intervention tried PTA.  Past Medical History  Diagnosis Date  . UTI (lower urinary tract infection)   . Bacterial vaginosis    Past Surgical History  Procedure Laterality Date  . Tubal ligation     Family History  Problem Relation Age of Onset  . Bipolar disorder Maternal Aunt    History  Substance Use Topics  . Smoking status: Never Smoker   . Smokeless tobacco: Never Used  . Alcohol Use: Yes     Comment: occ   OB History   Grav Para Term Preterm Abortions TAB SAB Ect Mult Living                 Review of Systems  Musculoskeletal: Positive for arthralgias and joint swelling.  All other systems reviewed and are negative.   Allergies  Erythromycin  Home Medications   Current Outpatient Rx  Name  Route  Sig  Dispense  Refill  . ibuprofen (ADVIL,MOTRIN) 200 MG tablet   Oral   Take 400 mg by mouth every 6 (six) hours as needed for pain. pain         . QUEtiapine (SEROQUEL XR) 200 MG 24 hr tablet   Oral   Take 200 mg by mouth at bedtime.          BP 166/89  Pulse 60  Temp(Src) 98.8 F (37.1 C)  (Oral)  Resp 18  Ht 5\' 4"  (1.626 m)  Wt 135 lb (61.236 kg)  BMI 23.16 kg/m2  SpO2 100%  LMP 06/28/2013  Physical Exam  Nursing note and vitals reviewed. Constitutional: She is oriented to person, place, and time. She appears well-developed and well-nourished. No distress.  HENT:  Head: Normocephalic and atraumatic.  Mouth/Throat: Oropharynx is clear and moist.  Eyes: Conjunctivae and EOM are normal. Pupils are equal, round, and reactive to light.  Neck: Normal range of motion. Neck supple.  Cardiovascular: Normal rate, regular rhythm and normal heart sounds.   Pulmonary/Chest: Effort normal and breath sounds normal. No respiratory distress. She has no wheezes.  Musculoskeletal: Normal range of motion. She exhibits no edema.       Right knee: She exhibits bony tenderness. She exhibits normal range of motion, no swelling and no effusion. Tenderness found. Medial joint line tenderness noted.       Legs: Right knee with tenderness to palpation along medial joint line. No gross deformity or effusion. Pain with full extension. No crepitus. Pain with valgus stress.  Strong distal pulse.  Sensation intact.  Neurological: She is alert and oriented to person, place, and time.  Skin: Skin  is warm and dry. She is not diaphoretic.  Psychiatric: She has a normal mood and affect.    ED Course  Procedures (including critical care time)  DIAGNOSTIC STUDIES: Oxygen Saturation is 100% on RA, normal by my interpretation.    COORDINATION OF CARE: 11:25 AM-Discussed treatment plan which includes xray with pt at bedside and pt agreed to plan.   Labs Review Labs Reviewed - No data to display Imaging Review Dg Knee Complete 4 Views Right  07/17/2013   CLINICAL DATA:  Knee pain following injury  EXAM: RIGHT KNEE - COMPLETE 4+ VIEW  COMPARISON:  None.  FINDINGS: There is no evidence of fracture, dislocation, or joint effusion. There is no evidence of arthropathy or other focal bone abnormality. Soft  tissues are unremarkable.  IMPRESSION: No acute abnormality noted.   Electronically Signed   By: Alcide CleverMark  Lukens M.D.   On: 07/17/2013 12:30     EKG Interpretation None      MDM   Final diagnoses:  Knee pain, right   X-ray negative for acute fracture or dislocation. On exam, knee feels stable with mild tenderness along medial joint line, some pain with valgus stress.  Patient placed in the sleeve. Advised to ice and elevate knee, and take ibuprofen at home for relief of pain. She was given orthopedic followup if symptoms worsen or no improvement within one week.   Discussed plan with patient, she acknowledged understanding and agreed with plan of care.  Return precautions advised.  I personally performed the services described in this documentation, which was scribed in my presence. The recorded information has been reviewed and is accurate.   Garlon HatchetLisa M Keyaan Lederman, PA-C 07/17/13 1325

## 2013-07-17 NOTE — Discharge Instructions (Signed)
Take the prescribed medication as directed to help with pain and swelling.  Ice and elevate knee at home. Follow-up with orthopedics if symptoms worsen or no improvement in 1 week. Return to the ED for new or worsening symptoms.

## 2013-07-17 NOTE — ED Notes (Signed)
Pt states she twisted her right knee two weeks ago. States it started hurting and got better. But now its getting worse, at night it gets stiff. Pt can bend right knee halfway through ROM but states its painful. Pt ambulatory to room with steady gait

## 2013-07-18 NOTE — ED Provider Notes (Signed)
Medical screening examination/treatment/procedure(s) were performed by non-physician practitioner and as supervising physician I was immediately available for consultation/collaboration.   EKG Interpretation None       Juliet RudeNathan R. Rubin PayorPickering, MD 07/18/13 [phone]

## 2013-07-24 ENCOUNTER — Encounter (HOSPITAL_COMMUNITY): Payer: Self-pay | Admitting: Emergency Medicine

## 2013-07-24 ENCOUNTER — Emergency Department (HOSPITAL_COMMUNITY)
Admission: EM | Admit: 2013-07-24 | Discharge: 2013-07-24 | Disposition: A | Discharge status: Home / Self Care | Payer: Medicaid Other | Attending: Emergency Medicine | Admitting: Emergency Medicine

## 2013-07-24 DIAGNOSIS — Z79899 Other long term (current) drug therapy: Secondary | ICD-10-CM | POA: Insufficient documentation

## 2013-07-24 DIAGNOSIS — H109 Unspecified conjunctivitis: Secondary | ICD-10-CM

## 2013-07-24 DIAGNOSIS — Z8619 Personal history of other infectious and parasitic diseases: Secondary | ICD-10-CM | POA: Insufficient documentation

## 2013-07-24 DIAGNOSIS — Z3202 Encounter for pregnancy test, result negative: Secondary | ICD-10-CM | POA: Insufficient documentation

## 2013-07-24 DIAGNOSIS — N898 Other specified noninflammatory disorders of vagina: Secondary | ICD-10-CM | POA: Insufficient documentation

## 2013-07-24 DIAGNOSIS — N390 Urinary tract infection, site not specified: Secondary | ICD-10-CM

## 2013-07-24 LAB — URINALYSIS, ROUTINE W REFLEX MICROSCOPIC
Bilirubin Urine: NEGATIVE
GLUCOSE, UA: NEGATIVE mg/dL
HGB URINE DIPSTICK: NEGATIVE
Ketones, ur: NEGATIVE mg/dL
LEUKOCYTES UA: NEGATIVE
Nitrite: POSITIVE — AB
Protein, ur: NEGATIVE mg/dL
SPECIFIC GRAVITY, URINE: 1.031 — AB (ref 1.005–1.030)
UROBILINOGEN UA: 0.2 mg/dL (ref 0.0–1.0)
pH: 5.5 (ref 5.0–8.0)

## 2013-07-24 LAB — POC URINE PREG, ED: PREG TEST UR: NEGATIVE

## 2013-07-24 LAB — URINE MICROSCOPIC-ADD ON

## 2013-07-24 MED ORDER — CEPHALEXIN 500 MG PO CAPS: 500.0000 mg | ORAL_CAPSULE | Freq: Three times a day (TID) | ORAL | Status: AC

## 2013-07-24 MED ORDER — CIPROFLOXACIN HCL 0.3 % OP OINT: TOPICAL_OINTMENT | OPHTHALMIC | Status: AC

## 2013-07-24 MED ORDER — CEPHALEXIN 500 MG PO CAPS: 500.0000 mg | ORAL_CAPSULE | Freq: Three times a day (TID) | ORAL | Status: DC

## 2013-07-24 MED ORDER — CEPHALEXIN 250 MG PO CAPS
500.0000 mg | ORAL_CAPSULE | Freq: Once | ORAL | Status: AC
  Administered 2013-07-24: 500 mg via ORAL
  Filled 2013-07-24: qty 2

## 2013-07-24 MED ORDER — TETRACAINE HCL 0.5 % OP SOLN
2.0000 [drp] | Freq: Once | OPHTHALMIC | Status: AC
  Administered 2013-07-24: 2 [drp] via OPHTHALMIC
  Filled 2013-07-24: qty 2

## 2013-07-24 MED ORDER — ERYTHROMYCIN 5 MG/GM OP OINT: TOPICAL_OINTMENT | OPHTHALMIC | Status: DC

## 2013-07-24 MED ORDER — FLUORESCEIN SODIUM 1 MG OP STRP
1.0000 | ORAL_STRIP | Freq: Once | OPHTHALMIC | Status: AC
  Administered 2013-07-24: 1 via OPHTHALMIC
  Filled 2013-07-24: qty 1

## 2013-07-24 NOTE — ED Notes (Signed)
Pt states she has irritation/watering/and itchyness to L eye, now feels it is moving to R eye. States LMP was 02/28, states having L sided abdominal cramping and white vaginal discharge. Last intercourse this Tuesday.

## 2013-07-24 NOTE — ED Provider Notes (Signed)
CSN: [id number]     Arrival date & time 07/24/13  1818 History   First MD Initiated Contact with Patient 07/24/13 1915     Chief Complaint  Patient presents with  . Conjunctivitis  . Vaginal Discharge   HPI Laura Mckenzie L Goins is a 43 y.o. female who presents primarily complaining of eye irritation.  She also has some dysuria and vaginal discharge.  Eye irritation: started 2-3 days ago.  No known injury or foreign body.  She does not wear contacts.  She started having some itching.  It is mild to moderate in severity.  She has noticed the sclera is a little pink.  She has had a moderate amount of drainage, described as clear.  She has not had any purulent drainage.  She has noted that both her upper and lower eyelid are a little swollen, she thinks from rubbing her eyes.  Over the past few hours, she has started having very minimal, similar symptoms in the other eye as well.  She has not tried any treatments.  Aggravated by nothing, relieved by nothing.  No foreign body sensation.  No visual changes.  No eye pain.  No headache.  No nausea or vomiting.  She has had mild nasal congestion.  No fever, chills, cough, ear pain, sneezing, rash, facial redness, warmth or pain, or diplopia.  She took allergy pills with no relief.  Vaginal discharge:  This is not her primary concern, but she mentioned it to the triage nurse, and endorses some mild vaginal discharge on ROS.  LMP started a few days ago, and miminal bleeding consistent with a normal menstrual cycle is ongoing.  She has noticed some scant, white vaginal discharge.  No abdominal pain, pelvic pain, fever, nausea, vomiting, dyspareunia, or other symptoms.  She does have a remote history of Gc/Chlamydia many years ago.  Also on ROS, she has mild dysuria.    Past Medical History  Diagnosis Date  . UTI (lower urinary tract infection)   . Bacterial vaginosis    Past Surgical History  Procedure Laterality Date  . Tubal ligation     Family History   Problem Relation Age of Onset  . Bipolar disorder Maternal Aunt    History  Substance Use Topics  . Smoking status: Never Smoker   . Smokeless tobacco: Never Used  . Alcohol Use: Yes     Comment: occ   OB History   Grav Para Term Preterm Abortions TAB SAB Ect Mult Living                 Review of Systems  Constitutional: Negative for fever, chills and diaphoresis.  HENT: Negative for congestion and rhinorrhea.   Eyes: Positive for discharge, redness and itching. Negative for photophobia, pain and visual disturbance.  Respiratory: Negative for cough, shortness of breath and wheezing.   Cardiovascular: Negative for chest pain and leg swelling.  Gastrointestinal: Negative for nausea, vomiting, abdominal pain and diarrhea.  Genitourinary: Positive for dysuria, vaginal bleeding and vaginal discharge. Negative for urgency, frequency, flank pain and difficulty urinating.  Musculoskeletal: Negative for neck pain and neck stiffness.  Skin: Negative for rash.  Neurological: Negative for weakness, numbness and headaches.  All other systems reviewed and are negative.      Allergies  Erythromycin  Home Medications   Current Outpatient Rx  Name  Route  Sig  Dispense  Refill  . ibuprofen (ADVIL,MOTRIN) 800 MG tablet   Oral   Take 1 tablet (800 mg total)  by mouth 3 (three) times daily.   21 tablet   0   . OVER THE COUNTER MEDICATION   Oral   Take 1 tablet by mouth daily as needed (allergies). "Allergy Relief"         . QUEtiapine (SEROQUEL XR) 200 MG 24 hr tablet   Oral   Take 200 mg by mouth at bedtime.         . cephALEXin (KEFLEX) 500 MG capsule   Oral   Take 1 capsule (500 mg total) by mouth 3 (three) times daily.   30 capsule   0   . ciprofloxacin (CILOXAN) 0.3 % ophthalmic ointment      2 drops to left eye every 2 hours while awake for five days.   3.5 g   0    BP 146/80  Pulse 54  Temp(Src) 98.1 F (36.7 C) (Oral)  Resp 18  Ht 5\' 4"  (1.626 m)  Wt  135 lb (61.236 kg)  BMI 23.16 kg/m2  SpO2 100%  LMP 06/28/2013 Physical Exam  Nursing note and vitals reviewed. Constitutional: She is oriented to person, place, and time. She appears well-developed and well-nourished. No distress.  HENT:  Head: Normocephalic and atraumatic.  Mouth/Throat: Oropharynx is clear and moist.  Eyes: Conjunctivae and EOM are normal. Pupils are equal, round, and reactive to light. Lids are everted and swept, no foreign bodies found. Right eye exhibits no chemosis, no discharge, no exudate and no hordeolum. No foreign body present in the right eye. Left eye exhibits discharge (scant clear drainage). Left eye exhibits no chemosis, no exudate and no hordeolum. No foreign body present in the left eye. Right conjunctiva is not injected. Right conjunctiva has no hemorrhage. Left conjunctiva is not injected. Left conjunctiva has no hemorrhage. No scleral icterus. Right eye exhibits normal extraocular motion. Left eye exhibits normal extraocular motion.  Stained with fluorescein and examined under Wood's lamp; no corneal abrasion or ulcer  Neck: Normal range of motion. Neck supple.  Cardiovascular: Normal rate, regular rhythm, normal heart sounds and intact distal pulses.  Exam reveals no gallop and no friction rub.   No murmur heard. Pulmonary/Chest: Effort normal and breath sounds normal. No respiratory distress. She has no wheezes. She has no rales.  Abdominal: Soft. She exhibits no distension. There is no tenderness. Hernia confirmed negative in the right inguinal area and confirmed negative in the left inguinal area.  Genitourinary: There is no rash, tenderness or lesion on the right labia. There is no rash, tenderness or lesion on the left labia. Cervix exhibits no motion tenderness, no discharge and no friability. Right adnexum displays no mass, no tenderness and no fullness. Left adnexum displays no mass, no tenderness and no fullness. No erythema or tenderness around the  vagina. No foreign body around the vagina. No signs of injury around the vagina. No vaginal discharge found.  Small amount of dark blood in vaginal vaulty, no active bleeding  Musculoskeletal: She exhibits no edema.  Neurological: She is alert and oriented to person, place, and time. No cranial nerve deficit. She exhibits normal muscle tone. Coordination normal.  Skin: Skin is warm and dry. No rash noted.  Psychiatric: She has a normal mood and affect.    ED Course  Procedures (including critical care time) Labs Review Labs Reviewed  URINALYSIS, ROUTINE W REFLEX MICROSCOPIC - Abnormal; Notable for the following:    Specific Gravity, Urine 1.031 (*)    Nitrite POSITIVE (*)    All other components  within normal limits  URINE MICROSCOPIC-ADD ON - Abnormal; Notable for the following:    Bacteria, UA MANY (*)    All other components within normal limits  GC/CHLAMYDIA PROBE AMP  RPR  HIV ANTIBODY (ROUTINE TESTING)  POC URINE PREG, ED   Imaging Review No results found.   EKG Interpretation None      MDM   43 yo F here with:  1. L eye irritation, itching, clear drainage, mildly swollen lids, slightly pink, no FB sensation or known injury; feels her vision is normal, no headache, fever, neuro symptoms, or facial cellulitis - fluorescein stained, no abrasion or ulcer; lids everted, no FB - consistent with mild conjunctivitis, likely viral.  - will treat with cipro drops (she is allergic to erythromycin); will also advise Claritin  2. UTI - she c/o mild dysuria; no fever, chills, nausea, vomiting, or other systemic sx; no flank pain - UA with nitrite, many bacteria - will treat with Keflex; gave dose in ED and RX  3.  Vaginal discharge - reports mild, white vaginal discharge; no systemic symptoms - on exam, she has dark blood in vaginal vault, no active bleeding, no FB, no CMT.  No adnexal mass or tenderness.  No discharge appreciated. - does have history of STI, but remotely -  sent STD panel, but will no treat empirically  She is stable for discharge, outpateint mgt.  Given ED return precautions.  Advised PCP f/u in 3-5 days for reevaluation otherwise.  Final diagnoses:  UTI (lower urinary tract infection)  Conjunctivitis, left eye       Toney Sang, MD 07/27/13 1742

## 2013-07-24 NOTE — ED Notes (Signed)
Pt c/o- L eye itching/watery. Thought it was allergies and took allergy pills without relief. States now R eye is watering.

## 2013-07-24 NOTE — ED Notes (Signed)
Woods lamp at the bedside. 

## 2013-07-25 LAB — HIV ANTIBODY (ROUTINE TESTING W REFLEX): HIV: NONREACTIVE

## 2013-07-25 LAB — GC/CHLAMYDIA PROBE AMP
CT Probe RNA: NEGATIVE
GC PROBE AMP APTIMA: NEGATIVE

## 2013-07-25 LAB — RPR: RPR Ser Ql: NONREACTIVE

## 2013-07-25 MED ORDER — POLYMYXIN B-TRIMETHOPRIM 10000-0.1 UNIT/ML-% OP SOLN: 2.0000 [drp] | OPHTHALMIC | Status: AC

## 2013-07-25 NOTE — Progress Notes (Signed)
   CARE MANAGEMENT ED NOTE 07/25/2013  Patient:  Laura Mckenzie,Laura Mckenzie   Account Number:  [id number]  Date Initiated:  07/25/2013  Documentation initiated by:  Radford PaxFERRERO,Karaline Buresh  Subjective/Objective Assessment:     Subjective/Objective Assessment Detail:     Action/Plan:   Action/Plan Detail:   Anticipated DC Date:       Status Recommendation to Physician:   Result of Recommendation:    Other ED Services  Consult Working Plan    DC Planning Services  Other  Medication Assistance    Choice offered to / List presented to:            Status of service:  Completed, signed off  ED Comments:   ED Comments Detail:  [facility].  Patient reports she was diagnosed with conjunctivitis.  Patient has Medicaid insurance.  Patient was able to purchase her po antibiotics without difficulty however, Cipro opthalmic ointment not covered by [employer] insurance.  As per patient, "I need pre authorization from the insurance company for the Cipro."  Patient requesting another prescription.  Patient reports her paharmacy is [facility] on the [address] phone number [phone].  EDCM called [facility] pharmacy to inquire about a medication for conjunctivitis that Medicaid will pay for, for a patient  who is allergic to Erythromycin. [facility] would not cover Cipro eye ointment.  [facility] pharmacist recommended Polytrim eye drops.  EDCM spoke to EDP Dr. Bernette MayersSheldon who saw patient last night and agrees with Polytrim eye drops, and placed order in Epic.  [facility] and spoke to pharmacy tech ViolaJessie.  EDCM called patient back to let her know prescription has been phoned into her pharmacy.  Patient thankful for assistance.  No further EDCM needs at this time.

## 2013-07-28 NOTE — ED Provider Notes (Signed)
I saw and evaluated the patient, reviewed the resident's note and I agree with the findings and plan.   EKG Interpretation None      Pt with eye swelling, ? Conjunctivitis vs allergies. Also having vaginal discharge  Charles B. Bernette MayersSheldon, MD 07/28/13 1312

## 2015-01-20 ENCOUNTER — Other Ambulatory Visit: Payer: Self-pay | Admitting: Obstetrics

## 2015-01-20 ENCOUNTER — Encounter (HOSPITAL_COMMUNITY): Payer: Self-pay | Admitting: *Deleted

## 2015-01-22 NOTE — H&P (Signed)
Laura Mckenzie, Laura Mckenzie NO.:  [id number]  MEDICAL RECORD NO.:  [id number]  LOCATION:  PERIO                         FACILITY:  [facility]  PHYSICIAN:  Kathreen Cosier, M.D.DATE OF BIRTH:  [birth year]  DATE OF ADMISSION:  01/27/2015 DATE OF DISCHARGE:                             HISTORY & PHYSICAL   HISTORY OF PRESENT ILLNESS:  The patient is a 44 year old gravida 4, para 4-0-0-4 whose periods are heavy every 21 days, and the patient wants HTA done.  PAST SURGERY HISTORY:  She has tubal ligation and C-section.  FAMILY HISTORY:  Negative.  SOCIAL HISTORY:  Negative for smoking, drinking, or drug use.  SYSTEM REVIEW:  Negative.  PHYSICAL EXAMINATION:  GENERAL: Well-developed female, in no distress. HEENT:  Negative. LUNGS:  Clear to P and A. HEART:  Regular rhythm.  No murmurs, no gallops. BREASTS:  Negative. ABDOMEN:  Nontender. PELVIC:  Uterus normal, negative adnexa. EXTREMITIES:  Negative.          ______________________________ Kathreen Cosier, M.D.     BAM/MEDQ  D:  01/21/2015  T:  01/21/2015  Job:  [id number]

## 2015-01-26 NOTE — Anesthesia Preprocedure Evaluation (Addendum)
Anesthesia Evaluation  Patient identified by MRN, date of birth, ID band Patient awake    Reviewed: Allergy & Precautions, NPO status , Patient's Chart, lab work & pertinent test results  History of Anesthesia Complications Negative for: history of anesthetic complications  Airway Mallampati: II  TM Distance: >3 FB Neck ROM: Full    Dental no notable dental hx. (+) Chipped,    Pulmonary neg pulmonary ROS,    Pulmonary exam normal breath sounds clear to auscultation       Cardiovascular negative cardio ROS Normal cardiovascular exam Rhythm:Regular Rate:Normal     Neuro/Psych PSYCHIATRIC DISORDERS negative neurological ROS  negative psych ROS   GI/Hepatic negative GI ROS, (+)     substance abuse  alcohol use,   Endo/Other  negative endocrine ROS  Renal/GU negative Renal ROS  Female GU complaint     Musculoskeletal negative musculoskeletal ROS (+)   Abdominal   Peds negative pediatric ROS (+)  Hematology  (+) anemia ,   Anesthesia Other Findings   Reproductive/Obstetrics negative OB ROS                            Anesthesia Physical Anesthesia Plan  ASA: II  Anesthesia Plan: General   Post-op Pain Management:    Induction: Intravenous  Airway Management Planned: LMA  Additional Equipment:   Intra-op Plan:   Post-operative Plan: Extubation in OR  Informed Consent: I have reviewed the patients History and Physical, chart, labs and discussed the procedure including the risks, benefits and alternatives for the proposed anesthesia with the patient or authorized representative who has indicated his/her understanding and acceptance.   Dental advisory given  Plan Discussed with: CRNA  Anesthesia Plan Comments:        Anesthesia Quick Evaluation

## 2015-01-27 ENCOUNTER — Ambulatory Visit (HOSPITAL_COMMUNITY): Payer: Medicaid Other | Admitting: Anesthesiology

## 2015-01-27 ENCOUNTER — Encounter (HOSPITAL_COMMUNITY): Payer: Self-pay | Admitting: Anesthesiology

## 2015-01-27 ENCOUNTER — Ambulatory Visit (HOSPITAL_COMMUNITY)
Admission: RE | Admit: 2015-01-27 | Discharge: 2015-01-27 | Disposition: A | Discharge status: Home / Self Care | Payer: Medicaid Other | Source: Ambulatory Visit | Attending: Obstetrics | Admitting: Obstetrics

## 2015-01-27 ENCOUNTER — Encounter (HOSPITAL_COMMUNITY)
Admission: RE | Disposition: A | Discharge status: Home / Self Care | Payer: Self-pay | Source: Ambulatory Visit | Attending: Obstetrics

## 2015-01-27 DIAGNOSIS — N938 Other specified abnormal uterine and vaginal bleeding: Secondary | ICD-10-CM | POA: Diagnosis not present

## 2015-01-27 DIAGNOSIS — Z9851 Tubal ligation status: Secondary | ICD-10-CM | POA: Diagnosis not present

## 2015-01-27 HISTORY — PX: DILITATION & CURRETTAGE/HYSTROSCOPY WITH HYDROTHERMAL ABLATION: SHX5570

## 2015-01-27 LAB — CBC
HEMATOCRIT: 27.3 % — AB (ref 36.0–46.0)
Hemoglobin: 7.3 g/dL — ABNORMAL LOW (ref 12.0–15.0)
MCH: 16.9 pg — AB (ref 26.0–34.0)
MCHC: 26.7 g/dL — AB (ref 30.0–36.0)
MCV: 63.2 fL — AB (ref 78.0–100.0)
Platelets: 348 10*3/uL (ref 150–400)
RBC: 4.32 MIL/uL (ref 3.87–5.11)
RDW: 21.9 % — AB (ref 11.5–15.5)
WBC: 3.8 10*3/uL — ABNORMAL LOW (ref 4.0–10.5)

## 2015-01-27 SURGERY — DILATATION & CURETTAGE/HYSTEROSCOPY WITH HYDROTHERMAL ABLATION
Anesthesia: General

## 2015-01-27 MED ORDER — LIDOCAINE HCL 1 % IJ SOLN
INTRAMUSCULAR | Status: DC | PRN
  Administered 2015-01-27: 10 mL

## 2015-01-27 MED ORDER — FENTANYL CITRATE (PF) 100 MCG/2ML IJ SOLN
INTRAMUSCULAR | Status: DC | PRN
  Administered 2015-01-27: 100 ug via INTRAVENOUS

## 2015-01-27 MED ORDER — FENTANYL CITRATE (PF) 100 MCG/2ML IJ SOLN
INTRAMUSCULAR | Status: AC
  Filled 2015-01-27: qty 4

## 2015-01-27 MED ORDER — ONDANSETRON HCL 4 MG/2ML IJ SOLN: 4.0000 mg | Freq: Once | INTRAMUSCULAR | Status: DC | PRN

## 2015-01-27 MED ORDER — MIDAZOLAM HCL 2 MG/2ML IJ SOLN
INTRAMUSCULAR | Status: AC
  Filled 2015-01-27: qty 4

## 2015-01-27 MED ORDER — LIDOCAINE HCL (CARDIAC) 20 MG/ML IV SOLN
INTRAVENOUS | Status: DC | PRN
  Administered 2015-01-27 (×2): 50 mg via INTRAVENOUS

## 2015-01-27 MED ORDER — PROPOFOL 10 MG/ML IV BOLUS
INTRAVENOUS | Status: AC
  Filled 2015-01-27: qty 20

## 2015-01-27 MED ORDER — HYDRALAZINE HCL 20 MG/ML IJ SOLN
INTRAMUSCULAR | Status: AC
  Administered 2015-01-27: 5 mg via INTRAVENOUS
  Filled 2015-01-27: qty 1

## 2015-01-27 MED ORDER — ONDANSETRON HCL 4 MG/2ML IJ SOLN
INTRAMUSCULAR | Status: DC | PRN
  Administered 2015-01-27: 4 mg via INTRAVENOUS

## 2015-01-27 MED ORDER — ACETAMINOPHEN 160 MG/5ML PO SOLN
ORAL | Status: AC
  Filled 2015-01-27: qty 40.6

## 2015-01-27 MED ORDER — SODIUM CHLORIDE 0.9 % IR SOLN
Status: DC | PRN
  Administered 2015-01-27: 1400 mL

## 2015-01-27 MED ORDER — ONDANSETRON HCL 4 MG/2ML IJ SOLN
INTRAMUSCULAR | Status: AC
Start: 2015-01-27 — End: 2015-01-27
  Filled 2015-01-27: qty 2

## 2015-01-27 MED ORDER — FENTANYL CITRATE (PF) 100 MCG/2ML IJ SOLN
INTRAMUSCULAR | Status: AC
  Administered 2015-01-27: 50 ug via INTRAVENOUS
  Filled 2015-01-27: qty 2

## 2015-01-27 MED ORDER — FENTANYL CITRATE (PF) 100 MCG/2ML IJ SOLN
25.0000 ug | INTRAMUSCULAR | Status: DC | PRN
  Administered 2015-01-27 (×2): 50 ug via INTRAVENOUS

## 2015-01-27 MED ORDER — SCOPOLAMINE 1 MG/3DAYS TD PT72
MEDICATED_PATCH | TRANSDERMAL | Status: AC
  Administered 2015-01-27: 1.5 mg via TRANSDERMAL
  Filled 2015-01-27: qty 1

## 2015-01-27 MED ORDER — PROPOFOL 10 MG/ML IV BOLUS
INTRAVENOUS | Status: DC | PRN
  Administered 2015-01-27: 200 mg via INTRAVENOUS

## 2015-01-27 MED ORDER — LIDOCAINE HCL 1 % IJ SOLN
INTRAMUSCULAR | Status: AC
  Filled 2015-01-27: qty 20

## 2015-01-27 MED ORDER — DEXAMETHASONE SODIUM PHOSPHATE 4 MG/ML IJ SOLN
INTRAMUSCULAR | Status: AC
  Filled 2015-01-27: qty 1

## 2015-01-27 MED ORDER — LACTATED RINGERS IV SOLN
INTRAVENOUS | Status: DC
  Administered 2015-01-27 (×3): via INTRAVENOUS

## 2015-01-27 MED ORDER — SCOPOLAMINE 1 MG/3DAYS TD PT72
1.0000 | MEDICATED_PATCH | Freq: Once | TRANSDERMAL | Status: DC
  Administered 2015-01-27: 1.5 mg via TRANSDERMAL

## 2015-01-27 MED ORDER — HYDRALAZINE HCL 20 MG/ML IJ SOLN
5.0000 mg | Freq: Once | INTRAMUSCULAR | Status: AC
  Administered 2015-01-27: 5 mg via INTRAVENOUS

## 2015-01-27 MED ORDER — DEXAMETHASONE SODIUM PHOSPHATE 4 MG/ML IJ SOLN
INTRAMUSCULAR | Status: DC | PRN
  Administered 2015-01-27: 4 mg via INTRAVENOUS

## 2015-01-27 MED ORDER — LIDOCAINE HCL (CARDIAC) 20 MG/ML IV SOLN
INTRAVENOUS | Status: AC
  Filled 2015-01-27: qty 5

## 2015-01-27 MED ORDER — GLYCOPYRROLATE 0.2 MG/ML IJ SOLN
INTRAMUSCULAR | Status: DC | PRN
  Administered 2015-01-27: 0.2 mg via INTRAVENOUS

## 2015-01-27 MED ORDER — ACETAMINOPHEN 160 MG/5ML PO SOLN
975.0000 mg | Freq: Once | ORAL | Status: AC
  Administered 2015-01-27: 975 mg via ORAL

## 2015-01-27 MED ORDER — MIDAZOLAM HCL 5 MG/5ML IJ SOLN
INTRAMUSCULAR | Status: DC | PRN
  Administered 2015-01-27: 2 mg via INTRAVENOUS

## 2015-01-27 SURGICAL SUPPLY — 10 items
CATH ROBINSON RED A/P 16FR (CATHETERS) ×3 IMPLANT
CLOTH BEACON ORANGE TIMEOUT ST (SAFETY) ×3 IMPLANT
CONTAINER PREFILL 10% NBF 60ML (FORM) ×6 IMPLANT
GLOVE BIO SURGEON STRL SZ8.5 (GLOVE) ×3 IMPLANT
GOWN STRL REUS W/TWL 2XL LVL3 (GOWN DISPOSABLE) ×3 IMPLANT
GOWN STRL REUS W/TWL LRG LVL3 (GOWN DISPOSABLE) ×3 IMPLANT
PACK VAGINAL MINOR WOMEN LF (CUSTOM PROCEDURE TRAY) ×3 IMPLANT
PAD OB MATERNITY 4.3X12.25 (PERSONAL CARE ITEMS) ×3 IMPLANT
SET GENESYS HTA PROCERVA (MISCELLANEOUS) ×3 IMPLANT
TOWEL OR 17X24 6PK STRL BLUE (TOWEL DISPOSABLE) ×6 IMPLANT

## 2015-01-27 NOTE — Anesthesia Postprocedure Evaluation (Signed)
  Anesthesia Post-op Note  Patient: Laura Mckenzie  Procedure(s) Performed: Procedure(s) (LRB): DILATATION & CURETTAGE/HYSTEROSCOPY WITH HYDROTHERMAL ABLATION (N/A)  Patient Location: PACU  Anesthesia Type: General  Level of Consciousness: awake and alert   Airway and Oxygen Therapy: Patient Spontanous Breathing  Post-op Pain: mild  Post-op Assessment: Post-op Vital signs reviewed, Patient's Cardiovascular Status Stable, Respiratory Function Stable, Patent Airway and No signs of Nausea or vomiting  Last Vitals:  Filed Vitals:   01/27/15 0720  BP: 137/70  Pulse: 64  Temp: 36.9 C  Resp: 20    Post-op Vital Signs: stable   Complications: No apparent anesthesia complications

## 2015-01-27 NOTE — Anesthesia Procedure Notes (Signed)
Procedure Name: LMA Insertion Date/Time: 01/27/2015 9:12 AM Performed by: Janeece Agee Pre-anesthesia Checklist: Patient identified, Emergency Drugs available, Suction available, Patient being monitored and Timeout performed Patient Re-evaluated:Patient Re-evaluated prior to inductionOxygen Delivery Method: Circle system utilized Preoxygenation: Pre-oxygenation with 100% oxygen Intubation Type: IV induction Ventilation: Mask ventilation without difficulty LMA: LMA inserted LMA Size: 4.0 Grade View: Grade II Number of attempts: 1

## 2015-01-27 NOTE — Transfer of Care (Signed)
Immediate Anesthesia Transfer of Care Note  Patient: Laura Mckenzie  Procedure(s) Performed: Procedure(s): DILATATION & CURETTAGE/HYSTEROSCOPY WITH HYDROTHERMAL ABLATION (N/A)  Patient Location: PACU  Anesthesia Type:General  Level of Consciousness: awake and oriented  Airway & Oxygen Therapy: Patient Spontanous Breathing and Patient connected to nasal cannula oxygen  Post-op Assessment: Report given to RN and Post -op Vital signs reviewed and stable  Post vital signs: Reviewed and stable  Last Vitals:  Filed Vitals:   01/27/15 0720  BP: 137/70  Pulse: 64  Temp: 36.9 C  Resp: 20    Complications: No apparent anesthesia complications

## 2015-01-27 NOTE — Discharge Instructions (Signed)
D&C Hysterosocpy °Care After °Read the instructions below. Refer to this sheet in the next few weeks. These instructions provide you with general information on caring for yourself after you leave the hospital. Your caregiver may also give you specific instructions.  °D&C or vacuum curettage is a minor operation. A D&C involves the stretching (dilatation) of the cervix and scraping (curettage) of the inside lining of the uterus. A vacuum curettage gently sucks out the lining and tissue in the uterus with a tube. You may have light cramping and bleeding for a couple of days to two weeks after the procedure. This procedure may be done in a hospital, outpatient clinic, or doctor's office. You may be given a drug to make you sleep (general anesthetic) or a drug that numbs the area (local anesthetic) in and around the cervix. °HOME CARE INSTRUCTIONS °· Do not drive for 24 hours.  °· Wait one week before returning to strenuous activities.  °· Take your temperature two times a day for 4 days and write it down. Provide these temperatures to your caregiver if they are abnormal (above 98.6° F or 37.0° C).  °· Avoid long periods of standing, and do no heavy lifting (more than 10 pounds), pushing or pulling.  °· Limit stair climbing to once or twice a day.  °· Take rest periods often.  °· You may resume your usual diet.  °· Drink plenty of fluids (6-8 glasses a day).  °· You should return to your usual bowel function. If constipation should occur, you may:  °· Take a mild laxative with permission from your caregiver.  °· Add fruit and bran to your diet.  °· Drink more fluids. This helps with constipation.  °· Take showers instead of baths until your caregiver gives you permission to take baths.  °· Do not go swimming or use a hot tub until your caregiver gives you permission.  °· Try to have someone with you or available for you the first 24 to 48 hours, especially if you had a general anesthetic.  °· Do not douche, use  tampons, or have intercourse until after your follow-up appointment, or when your caregiver approves.  °· Only take over-the-counter or prescription medicines for pain, discomfort, or fever as directed by your caregiver. Do not take aspirin. It can cause bleeding.  °· If a prescription was given, follow your caregiver's directions. You may be given a medicine that kills germs (antibiotic) to prevent an infection.  °· Keep all your follow-up appointments recommended by your caregiver.  °SEEK MEDICAL CARE IF: °· You have increasing cramps or pain not relieved with medication.  °· You develop belly (abdominal) pain which does not seem to be related to the same area of earlier cramping and pain.  °· You feel dizzy or feel like fainting.  °· You have bad smelling vaginal discharge.  °· You develop a rash.  °· You develop a reaction or allergy to your medication.  °SEEK IMMEDIATE MEDICAL CARE IF: °· Bleeding is heavier than a normal menstrual period.  °· You have an oral temperature above 100.6, not controlled by medicine.  °· You develop chest pain.  °· You develop shortness of breath.  °· You pass out.  °· You develop pain in your shoulder strap area.  °· You develop heavy vaginal bleeding with or without blood clots.  °MAKE SURE YOU:  °· Understand these instructions.  °· Will watch your condition.  °· Will get help right away if you are   not doing well or get worse.  °UPDATED HEALTH PRACTICES °· A PAP smear is done to screen for cervical cancer.  °· The first PAP smear should be done at age 21.  °· Between ages 21 and 29, PAP smears are repeated every 2 years.  °· Beginning at age 30, you are advised to have a PAP smear every 3 years as long as your past 3 PAP smears have been normal.  °· Some women have medical problems that increase the chance of getting cervical cancer. Talk to your caregiver about these problems. It is especially important to talk to your caregiver if a new problem develops soon after your last PAP  smear. In these cases, your caregiver may recommend more frequent screening and Pap smears.  °· The above recommendations are the same for women who have or have not gotten the vaccine for HPV (Human Papillomavirus).  °· If you had a hysterectomy for a problem that was not a cancer or a condition that could lead to cancer, then you no longer need Pap smears.  °· If you are between ages 65 and 70, and you have had normal Pap smears going back 10 years, you no longer need Pap smears.  °· If you have had past treatment for cervical cancer or a condition that could lead to cancer, you need Pap smears and screening for cancer for at least 20 years after your treatment.  °· Continue monthly self-breast examinations. Your caregiver can provide information and instructions for self-breast examination.  °Document Released: 04/14/2000 Document Re-Released: 10/05/2009 °[employer]® Patient Information ©2011 [employer]. °

## 2015-01-27 NOTE — Op Note (Signed)
Preop diagnosis dysfunctional uterine bleeding postop diagnosis the same Procedure hysteroscopy D&C and HTA Surgeon Dr. Francoise Ceo Anesthesia general Procedure patient placed the in the lithotomy position abdomen perineum and vagina prepped and draped  Bladder emptied  with a straight catheter bimanual exam revealed uterus to be normal size negative adnexa speculum placed in the vagina and the cervix injected with 10 cc of 1% Xylocaine anterior lip of the cervix grasped with tenaculum endometrial cavity sounded 10 cm cavity was posterior cervix dilated #25 Pratt hysteroscope inserted and it was noted she had some polyps the hysteroscope removed and a sharp curettage done large amount of tissue obtained the hysteroscope was then reinserted for the ablation and the fluid heated to 80C and cavity integrity test past and ablation performed 10 minutes after that time the there was a two-minute cool down and the hysteroscope removed patient tolerated the procedure well taken to recovery room in good condition

## 2015-01-27 NOTE — H&P (Signed)
  There has been no change in her history and physical since the original dictation 

## 2015-01-28 ENCOUNTER — Encounter (HOSPITAL_COMMUNITY): Payer: Self-pay | Admitting: Obstetrics

## 2021-01-16 ENCOUNTER — Emergency Department (HOSPITAL_COMMUNITY)
Admission: EM | Admit: 2021-01-16 | Discharge: 2021-01-16 | Disposition: A | Discharge status: Home / Self Care | Payer: BC Managed Care – PPO | Attending: Emergency Medicine | Admitting: Emergency Medicine

## 2021-01-16 ENCOUNTER — Emergency Department (HOSPITAL_COMMUNITY): Payer: BC Managed Care – PPO

## 2021-01-16 DIAGNOSIS — M546 Pain in thoracic spine: Secondary | ICD-10-CM | POA: Diagnosis not present

## 2021-01-16 DIAGNOSIS — Z5321 Procedure and treatment not carried out due to patient leaving prior to being seen by health care provider: Secondary | ICD-10-CM | POA: Insufficient documentation

## 2021-01-16 DIAGNOSIS — Y92410 Unspecified street and highway as the place of occurrence of the external cause: Secondary | ICD-10-CM | POA: Diagnosis not present

## 2021-01-16 DIAGNOSIS — M542 Cervicalgia: Secondary | ICD-10-CM | POA: Diagnosis not present

## 2021-01-16 DIAGNOSIS — M25562 Pain in left knee: Secondary | ICD-10-CM | POA: Insufficient documentation

## 2021-01-16 DIAGNOSIS — M25511 Pain in right shoulder: Secondary | ICD-10-CM | POA: Diagnosis not present
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
## 2021-01-17 ENCOUNTER — Other Ambulatory Visit: Payer: Self-pay

## 2021-01-17 ENCOUNTER — Emergency Department (HOSPITAL_COMMUNITY): Payer: BC Managed Care – PPO

## 2021-01-17 ENCOUNTER — Emergency Department (HOSPITAL_COMMUNITY)
Admission: EM | Admit: 2021-01-17 | Discharge: 2021-01-17 | Disposition: A | Discharge status: Home / Self Care | Payer: BC Managed Care – PPO | Attending: Emergency Medicine | Admitting: Emergency Medicine

## 2021-01-17 DIAGNOSIS — Y92410 Unspecified street and highway as the place of occurrence of the external cause: Secondary | ICD-10-CM | POA: Diagnosis not present

## 2021-01-17 DIAGNOSIS — M25512 Pain in left shoulder: Secondary | ICD-10-CM | POA: Diagnosis present

## 2021-01-17 DIAGNOSIS — T148XXA Other injury of unspecified body region, initial encounter: Secondary | ICD-10-CM

## 2021-01-17 MED ORDER — CYCLOBENZAPRINE HCL 10 MG PO TABS
10.0000 mg | ORAL_TABLET | Freq: Once | ORAL | Status: AC
  Administered 2021-01-17: 10 mg via ORAL
  Filled 2021-01-17: qty 1

## 2021-01-17 MED ORDER — OXYCODONE-ACETAMINOPHEN 5-325 MG PO TABS
1.0000 | ORAL_TABLET | Freq: Once | ORAL | Status: AC
  Administered 2021-01-17: 1 via ORAL
  Filled 2021-01-17: qty 1

## 2021-01-17 MED ORDER — CYCLOBENZAPRINE HCL 10 MG PO TABS: 10.0000 mg | ORAL_TABLET | Freq: Three times a day (TID) | ORAL | 0 refills | Status: AC | PRN

## 2021-01-17 NOTE — Discharge Instructions (Signed)
Follow-up with your primary care doctor for recheck next week.  Take Tylenol and Motrin as needed for pain control.  Also recommend trying the muscle relaxer as needed.  Note this can make you mildly drowsy so be taken when driving.  Come back to ER if you develop difficulty breathing abdominal pain or other new concerning symptom.

## 2021-01-17 NOTE — ED Notes (Signed)
Patient transported to X-ray 
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
